# Patient Record
Sex: Female | Born: 1995 | Race: White | Hispanic: No | Marital: Single | Smoking: Never smoker
Health system: Southern US, Community
[De-identification: ages and names within clinical notes are randomized; demographics above are authoritative.]

## PROBLEM LIST (undated history)

## (undated) DIAGNOSIS — K219 Gastro-esophageal reflux disease without esophagitis: Secondary | ICD-10-CM

## (undated) DIAGNOSIS — R000 Tachycardia, unspecified: Secondary | ICD-10-CM

## (undated) DIAGNOSIS — I498 Other specified cardiac arrhythmias: Secondary | ICD-10-CM

## (undated) DIAGNOSIS — G901 Familial dysautonomia [Riley-Day]: Secondary | ICD-10-CM

## (undated) DIAGNOSIS — Q796 Ehlers-Danlos syndrome, unspecified: Secondary | ICD-10-CM

## (undated) DIAGNOSIS — I951 Orthostatic hypotension: Secondary | ICD-10-CM

## (undated) DIAGNOSIS — M249 Joint derangement, unspecified: Secondary | ICD-10-CM

## (undated) DIAGNOSIS — G90A Postural orthostatic tachycardia syndrome (POTS): Secondary | ICD-10-CM

---
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
## 2017-02-22 ENCOUNTER — Telehealth: Payer: Self-pay | Admitting: Internal Medicine

## 2017-02-22 DIAGNOSIS — G90A Postural orthostatic tachycardia syndrome (POTS): Secondary | ICD-10-CM

## 2017-02-22 DIAGNOSIS — I951 Orthostatic hypotension: Principal | ICD-10-CM

## 2017-02-22 DIAGNOSIS — I498 Other specified cardiac arrhythmias: Secondary | ICD-10-CM

## 2017-02-22 DIAGNOSIS — R000 Tachycardia, unspecified: Principal | ICD-10-CM

## 2017-02-22 DIAGNOSIS — Q796 Ehlers-Danlos syndrome, unspecified: Secondary | ICD-10-CM

## 2017-02-22 NOTE — Telephone Encounter (Signed)
Called pt and left a VM informing pt that she has refills left at her pharmacy already and if she has any other problems, questions or concerns to call our office.

## 2017-02-22 NOTE — Telephone Encounter (Signed)
Follow up      Pt has an appt 03-26-17 with Dr Tenny Craw.  Mother states that when pt saw the pediatric cardiologist, she had an echo every year.  Calling to see if she will need an echo before/during her may 21st ov.  Please call

## 2017-02-22 NOTE — Telephone Encounter (Signed)
New message       *STAT* If patient is at the pharmacy, call can be transferred to refill team.   1. Which medications need to be refilled? (please list name of each medication and dose if known) atenolol  2. Which pharmacy/location (including street and city if local pharmacy) is medication to be sent to? [facility] [address]. Do they need a 30 day or 90 day supply? 90 day

## 2017-02-22 NOTE — Telephone Encounter (Signed)
Will route to Dr. Tenny Craw for review and advisement on if echo is needed.

## 2017-02-27 NOTE — Telephone Encounter (Signed)
Left detailed message on mother's voice mail (per DPR) that we will schedule echo for prior to her appointment with Dr. Tenny Craw 03/26/17  Order placed.

## 2017-02-27 NOTE — Telephone Encounter (Signed)
Please set up for echo Hx of POTS and Ehlers-Danlos syndrome

## 2017-03-06 ENCOUNTER — Other Ambulatory Visit: Payer: Self-pay | Admitting: *Deleted

## 2017-03-06 DIAGNOSIS — Q796 Ehlers-Danlos syndrome, unspecified: Secondary | ICD-10-CM

## 2017-03-06 DIAGNOSIS — M545 Low back pain, unspecified: Secondary | ICD-10-CM

## 2017-03-07 ENCOUNTER — Encounter: Payer: Self-pay | Admitting: *Deleted

## 2017-03-14 ENCOUNTER — Ambulatory Visit (HOSPITAL_COMMUNITY): Payer: 59 | Attending: Cardiovascular Disease

## 2017-03-14 ENCOUNTER — Other Ambulatory Visit: Payer: Self-pay

## 2017-03-14 DIAGNOSIS — I951 Orthostatic hypotension: Secondary | ICD-10-CM | POA: Diagnosis present

## 2017-03-14 DIAGNOSIS — I498 Other specified cardiac arrhythmias: Secondary | ICD-10-CM

## 2017-03-14 DIAGNOSIS — Q796 Ehlers-Danlos syndrome, unspecified: Secondary | ICD-10-CM

## 2017-03-14 DIAGNOSIS — G90A Postural orthostatic tachycardia syndrome (POTS): Secondary | ICD-10-CM

## 2017-03-14 DIAGNOSIS — I071 Rheumatic tricuspid insufficiency: Secondary | ICD-10-CM | POA: Diagnosis not present

## 2017-03-14 DIAGNOSIS — R000 Tachycardia, unspecified: Secondary | ICD-10-CM | POA: Diagnosis present

## 2017-03-15 ENCOUNTER — Ambulatory Visit
Admission: RE | Admit: 2017-03-15 | Discharge: 2017-03-15 | Disposition: A | Discharge status: Home / Self Care | Payer: 59 | Source: Ambulatory Visit | Attending: Sports Medicine | Admitting: Sports Medicine

## 2017-03-15 DIAGNOSIS — M545 Low back pain, unspecified: Secondary | ICD-10-CM

## 2017-03-15 DIAGNOSIS — Q796 Ehlers-Danlos syndrome, unspecified: Secondary | ICD-10-CM

## 2017-03-16 ENCOUNTER — Other Ambulatory Visit: Payer: BLUE CROSS/BLUE SHIELD

## 2017-03-22 ENCOUNTER — Ambulatory Visit (INDEPENDENT_AMBULATORY_CARE_PROVIDER_SITE_OTHER): Payer: 59 | Admitting: Sports Medicine

## 2017-03-22 ENCOUNTER — Encounter: Payer: Self-pay | Admitting: Sports Medicine

## 2017-03-22 DIAGNOSIS — M2480 Other specific joint derangements of unspecified joint, not elsewhere classified: Secondary | ICD-10-CM | POA: Diagnosis not present

## 2017-03-22 DIAGNOSIS — I951 Orthostatic hypotension: Secondary | ICD-10-CM | POA: Diagnosis not present

## 2017-03-22 DIAGNOSIS — R000 Tachycardia, unspecified: Secondary | ICD-10-CM

## 2017-03-22 DIAGNOSIS — I498 Other specified cardiac arrhythmias: Secondary | ICD-10-CM

## 2017-03-22 DIAGNOSIS — G90A Postural orthostatic tachycardia syndrome (POTS): Secondary | ICD-10-CM

## 2017-03-23 NOTE — Assessment & Plan Note (Signed)
Note she is having some increased bouts of tachycardia Nortriptyline may be an issue but is only medicine that has helped control her pain adequately  Lot os fatigue and question if even low dose atenolol contributes  I suggested D/W Dr Tenny Crawoss and seeing if any change in meds would help

## 2017-03-23 NOTE — Assessment & Plan Note (Signed)
This is causing a lot of pain now primairly at low back and SI joints  I think the specific PT program is a good plan ([facility] Pilates program seemed too hard  Cont nortriptyline Reck after trial on this over next 3 mos

## 2017-03-23 NOTE — Progress Notes (Signed)
Fatigue and sacral and LBP  Patient with known EDS 3 and chronic arthralgias/ [place] pain syndrome related to this WE discussed Naltrexone trial but she decided not to try this yet Now back on nortriptyline 40 and this helps moderate pain Does not tolerate opioids Nortriptyline tends to worsen her POTS sxs particularly the tachycardia  Now with lots of pain over sacrum and sits on special wedge pillow Pain into low back  She has an EDS network of specialists and they wanted an MRI before she starts on a special PT program.  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

## 2017-03-26 ENCOUNTER — Encounter: Payer: Self-pay | Admitting: Internal Medicine

## 2017-03-26 ENCOUNTER — Ambulatory Visit (INDEPENDENT_AMBULATORY_CARE_PROVIDER_SITE_OTHER): Payer: 59 | Admitting: Internal Medicine

## 2017-03-26 VITALS — BP 108/76 | HR 73 | Ht 63.0 in | Wt 97.6 lb

## 2017-03-26 DIAGNOSIS — D8940 Mast cell activation, unspecified: Secondary | ICD-10-CM

## 2017-03-26 DIAGNOSIS — I951 Orthostatic hypotension: Secondary | ICD-10-CM

## 2017-03-26 DIAGNOSIS — I498 Other specified cardiac arrhythmias: Secondary | ICD-10-CM

## 2017-03-26 DIAGNOSIS — R000 Tachycardia, unspecified: Secondary | ICD-10-CM

## 2017-03-26 DIAGNOSIS — G90A Postural orthostatic tachycardia syndrome (POTS): Secondary | ICD-10-CM

## 2017-03-26 MED ORDER — SODIUM CHLORIDE 0.9 % IV BOLUS (SEPSIS)
1000.0000 mL | Freq: Once | INTRAVENOUS | Status: AC
  Administered 2017-03-26: 1000 mL via INTRAVENOUS

## 2017-03-26 NOTE — Progress Notes (Signed)
Cardiology Office Note   Date:  03/26/2017   ID:  Juwana Thoreson, DOB 1996-09-26, MRN [id number]  PCP:  Maurice Small, MD  Cardiologist:   Dietrich Pates, MD    F/U of autonomic dysfunction     History of Present Illness: Jean Larson is a 21 y.o. female with a history of Ehlers-Danlos syndrome and POTS  I saw her back in Oct 2017  She is alos followed by B Fields and by Izell Mapleton [phone].   Since seen she continues to have dizzy and syncopal spells  Saturday had 3 syncoapl spells She says she tryies to drink fluids  Has signfi problems with food intake  Trying to get seen at [facility] autonomic GI program.  Patient is still having school at home with a reduced load  This semester has been d Difficult Recent echo    Pt has mast cell disorder  Being treated with cromolyn  Looking for referral for second opinion     Current Meds  Medication Sig  . atenolol (TENORMIN) 25 MG tablet   . cromolyn (GASTROCROM) 100 MG/5ML solution cromolyn 100 mg/5 mL oral concentrate  INHALE 1 VIAL BEFORE EACH MEAL QID  . EPINEPHrine 0.3 mg/0.3 mL IJ SOAJ injection INJECT INTRAMUSCULARLY AS DIRECTED  . fexofenadine (ALLEGRA) 30 MG tablet Take 30 mg by mouth 2 (two) times daily.  . methylphenidate (RITALIN) 10 MG tablet Take 10 mg by mouth daily.   Marland Kitchen METRONIDAZOLE, TOPICAL, 0.75 % LOTN APP TOPICALLY A THIN LAYER TO FACE BID  . MICROGESTIN 1-20 MG-MCG tablet Take 1 tablet by mouth daily.  . nortriptyline (PAMELOR) 10 MG capsule nortriptyline 10 mg capsule  TK 1 TO 3 CS PO QHS UTD  . ondansetron (ZOFRAN) 8 MG tablet TK 1 T PO QD PRN  . TRETINOIN EX Apply topically daily.     Allergies:   Cherry and Plum pulp   Past Medical History:  Diagnosis Date  . Dysautonomia   . Ehlers-Danlos disease   . GERD (gastroesophageal reflux disease)   . Hypermobility of joint   . POTS (postural orthostatic tachycardia syndrome)     History reviewed. No pertinent surgical history.   Social  History:  The patient  reports that she has never smoked. She has never used smokeless tobacco. She reports that she does not drink alcohol or use drugs.   Family History:  The patient's family history includes Alzheimer's disease in her maternal grandfather; Asthma in her sister; Heart disease in her maternal grandfather; Hypertension in her maternal grandfather; Parkinson's disease in her maternal grandfather; Stroke in her maternal grandfather.    ROS:  Please see the history of present illness. All other systems are reviewed and  Negative to the above problem except as noted.    PHYSICAL EXAM: VS:  BP 112/74   Pulse (!) 126   Ht 5\' 3"  (1.6 m)   Wt 97 lb 9.6 oz (44.3 kg)   SpO2 99%   BMI 17.29 kg/m   GEN: Thin 21 yo , in no acute distress   Yawns a lot HEENT: normal  Neck: no JVD, carotid bruits, or masses Cardiac: RRR; no murmurs, rubs, or gallops,no edema  Respiratory:  clear to auscultation bilaterally, normal work of breathing GI: soft, nontender, nondistended, + BS  No hepatomegaly  MS: no deformity Moving all extremities   Skin: warm and dry, no rash Neuro:  Strength and sensation are intact Psych: euthymic mood, full affect   EKG:  EKG is not  ordered today.   Lipid Panel No results found for: CHOL, TRIG, HDL, CHOLHDL, VLDL, LDLCALC, LDLDIRECT    Wt Readings from Last 3 Encounters:  03/26/17 97 lb 9.6 oz (44.3 kg) (3 %, Z= -1.95)*  03/22/17 96 lb (43.5 kg) (2 %, Z= -2.11)*  09/14/16 95 lb (43.1 kg) (2 %, Z= -2.15)*   * Growth percentiles are based on CDC 2-20 Years data.      ASSESSMENT AND PLAN:  1  Autonomicdysfunction Pt continues to be symptomatic  HR increased with standing form 105 (laying) to 122 (standing) With syncopal spells I would recomm IV fluids  Follow HR and BP Will review medication history and contact MD in [place]  2  Mast cell activation syndrome   Pt given name for L Afrin in [place]  Current medicines are reviewed at length with the patient  today.  The patient does not have concerns regarding medicines.  Signed, Dietrich PatesPaula Taquita Demby, MD  03/26/2017 12:28 PM    [facility] [address], [place], [place]  [address] Phone: [phone]; Fax: [phone]

## 2017-03-27 ENCOUNTER — Ambulatory Visit: Payer: 59 | Admitting: Sports Medicine

## 2017-04-04 ENCOUNTER — Telehealth: Payer: Self-pay | Admitting: Internal Medicine

## 2017-04-04 DIAGNOSIS — I951 Orthostatic hypotension: Principal | ICD-10-CM

## 2017-04-04 DIAGNOSIS — G90A Postural orthostatic tachycardia syndrome (POTS): Secondary | ICD-10-CM

## 2017-04-04 DIAGNOSIS — I498 Other specified cardiac arrhythmias: Secondary | ICD-10-CM

## 2017-04-04 DIAGNOSIS — R000 Tachycardia, unspecified: Principal | ICD-10-CM

## 2017-04-04 NOTE — Telephone Encounter (Signed)
New message    Pt is calling stating that iv's did work. She said she is a POTs pt and that specialist is recommending a holter monitor. She is calling to find out if she can do this. She is also going to send her all the consult notes before Friday.

## 2017-04-05 ENCOUNTER — Telehealth: Payer: Self-pay | Admitting: Internal Medicine

## 2017-04-05 NOTE — Telephone Encounter (Signed)
Walk In pt Form-pt dropped off records for Dr.Ross. Placed in ross doc box.

## 2017-04-06 NOTE — Telephone Encounter (Signed)
Notes received and given to Dr. Tenny Crawoss today.  Will route this message to her to review/recommendations regarding holter monitor.

## 2017-04-09 NOTE — Telephone Encounter (Signed)
Spoke to mother   WOuld recomm pt be set up for CBC with differential, Ferritin, BMET and 24 hour urine sodium

## 2017-04-09 NOTE — Telephone Encounter (Signed)
Spoke with patient's mother to make arrangements for lab appointment for blood tests here on Wednesday morning and gave her instructions to then go downstairs to [facility] for 24 hour urine sodium. Patient's mother verbalized understanding and agreement with plan and thanked me for the call.

## 2017-04-09 NOTE — Telephone Encounter (Signed)
Left message for patient to call back about ordering lab results. 24 hour urine sodium will need to be performed at the [facility] location on the first floor of our office.

## 2017-04-11 ENCOUNTER — Other Ambulatory Visit: Payer: 59 | Admitting: *Deleted

## 2017-04-11 DIAGNOSIS — I498 Other specified cardiac arrhythmias: Secondary | ICD-10-CM

## 2017-04-11 DIAGNOSIS — R000 Tachycardia, unspecified: Principal | ICD-10-CM

## 2017-04-11 DIAGNOSIS — G90A Postural orthostatic tachycardia syndrome (POTS): Secondary | ICD-10-CM

## 2017-04-11 DIAGNOSIS — I951 Orthostatic hypotension: Principal | ICD-10-CM

## 2017-04-12 LAB — FERRITIN: Ferritin: 61 ng/mL (ref 15–77)

## 2017-04-12 LAB — CBC WITH DIFFERENTIAL/PLATELET
Basophils Absolute: 0 10*3/uL (ref 0.0–0.2)
Basos: 1 %
EOS (ABSOLUTE): 0.1 10*3/uL (ref 0.0–0.4)
Eos: 3 %
Hematocrit: 41.7 % (ref 34.0–46.6)
Hemoglobin: 13.6 g/dL (ref 11.1–15.9)
Immature Grans (Abs): 0 10*3/uL (ref 0.0–0.1)
Immature Granulocytes: 0 %
Lymphocytes Absolute: 2.3 10*3/uL (ref 0.7–3.1)
Lymphs: 47 %
MCH: 29.3 pg (ref 26.6–33.0)
MCHC: 32.6 g/dL (ref 31.5–35.7)
MCV: 90 fL (ref 79–97)
Monocytes Absolute: 0.2 10*3/uL (ref 0.1–0.9)
Monocytes: 4 %
Neutrophils Absolute: 2.2 10*3/uL (ref 1.4–7.0)
Neutrophils: 45 %
Platelets: 212 10*3/uL (ref 150–379)
RBC: 4.64 x10E6/uL (ref 3.77–5.28)
RDW: 12.9 % (ref 12.3–15.4)
WBC: 4.9 10*3/uL (ref 3.4–10.8)

## 2017-04-14 LAB — SODIUM, URINE, 24 HOUR
Sodium, 24H Ur: 59 mmol/24 hr (ref 39–258)
Sodium, Ur: 27 mmol/L

## 2017-05-02 ENCOUNTER — Encounter: Payer: Self-pay | Admitting: Internal Medicine

## 2017-05-03 ENCOUNTER — Encounter: Payer: Self-pay | Admitting: Internal Medicine

## 2017-05-17 NOTE — Telephone Encounter (Signed)
Spoke to Reynolds American[facility]ry Afrin ([place]  Specializes in Mast Cell Activation disorder.  He has agreed to see Jean Larson  Spoke to mother and gave her number  For his office  [phone]

## 2017-06-08 ENCOUNTER — Telehealth: Payer: Self-pay | Admitting: Internal Medicine

## 2017-06-08 NOTE — Telephone Encounter (Signed)
Patient going to see mast cell specialist in [place] on Tue,  They request a 24 hr urine to be done on Monday.  She needs the supplies.  Advised her to get prescription from that doctor and take to [facility] for collection container. She is appreciative for the information provided.  Advised to call back with any other concerns.

## 2017-06-08 NOTE — Telephone Encounter (Signed)
New message   Pt is calling asking if she can come get the kit for a 24 hour urine. She said it's so she can go to the specialist doctor in [place].

## 2017-06-18 ENCOUNTER — Telehealth: Payer: Self-pay | Admitting: Internal Medicine

## 2017-06-18 NOTE — Telephone Encounter (Signed)
New message    Pt is calling asking for an appt to come in for IV fluids. She said her symptoms have been really bad this week.

## 2017-06-18 NOTE — Telephone Encounter (Signed)
Advised patient that Dr. Tenny Crawoss is out of town.  Advised that typically the office is not equipped to bring her in for fluids on an as needed basis.  She has had a flare over the past week and feels that IV fluids help her bounce back from this.    Patient reports near syncope a couple times this past week.  I advised she could go to the ER for evaluation and potentially fluids, or get in touch with her PCP who may be able to arrange something since Dr. Tenny Crawoss is not available this week.  She is aware I will forward to Dr. Tenny Crawoss to make her aware.

## 2017-07-23 ENCOUNTER — Ambulatory Visit (INDEPENDENT_AMBULATORY_CARE_PROVIDER_SITE_OTHER): Payer: 59 | Admitting: Internal Medicine

## 2017-07-23 ENCOUNTER — Telehealth: Payer: Self-pay | Admitting: *Deleted

## 2017-07-23 ENCOUNTER — Ambulatory Visit (HOSPITAL_COMMUNITY)
Admission: RE | Admit: 2017-07-23 | Discharge: 2017-07-23 | Disposition: A | Discharge status: Home / Self Care | Payer: 59 | Source: Ambulatory Visit | Attending: Internal Medicine | Admitting: Internal Medicine

## 2017-07-23 ENCOUNTER — Other Ambulatory Visit (HOSPITAL_COMMUNITY): Payer: Self-pay | Admitting: *Deleted

## 2017-07-23 ENCOUNTER — Other Ambulatory Visit: Payer: Self-pay | Admitting: *Deleted

## 2017-07-23 ENCOUNTER — Encounter: Payer: Self-pay | Admitting: Internal Medicine

## 2017-07-23 VITALS — BP 110/70 | HR 78 | Ht 63.03 in | Wt 101.0 lb

## 2017-07-23 DIAGNOSIS — Q796 Ehlers-Danlos syndrome: Secondary | ICD-10-CM | POA: Diagnosis present

## 2017-07-23 DIAGNOSIS — R0989 Other specified symptoms and signs involving the circulatory and respiratory systems: Secondary | ICD-10-CM

## 2017-07-23 DIAGNOSIS — I951 Orthostatic hypotension: Secondary | ICD-10-CM | POA: Diagnosis not present

## 2017-07-23 DIAGNOSIS — G901 Familial dysautonomia [Riley-Day]: Secondary | ICD-10-CM | POA: Insufficient documentation

## 2017-07-23 DIAGNOSIS — D8940 Mast cell activation, unspecified: Secondary | ICD-10-CM | POA: Diagnosis not present

## 2017-07-23 DIAGNOSIS — R000 Tachycardia, unspecified: Secondary | ICD-10-CM

## 2017-07-23 DIAGNOSIS — I498 Other specified cardiac arrhythmias: Secondary | ICD-10-CM

## 2017-07-23 DIAGNOSIS — G90A Postural orthostatic tachycardia syndrome (POTS): Secondary | ICD-10-CM

## 2017-07-23 MED ORDER — SODIUM CHLORIDE 0.9 % IV SOLN
Freq: Once | INTRAVENOUS | Status: AC
  Administered 2017-07-23: 12:00:00 via INTRAVENOUS

## 2017-07-23 NOTE — Progress Notes (Signed)
Cardiology Office Note   Date:  07/23/2017   ID:  Harman Ferrin, DOB 22-Nov-1995, MRN [id number]  PCP:  Maurice Small, MD  Cardiologist:   Dietrich Pates, MD    F/U of autonomic dysfunction     History of Present Illness: Jean Larson is a 21 y.o. female with a history of Ehlers-Danlos syndrome and POTS  I saw her back in Oct 2017  She is alos followed by B Fields and by Izell Oatfield [phone].   Since seen she continues to have dizzy and syncopal spells  Saturday had 3 syncoapl spells She says she tryies to drink fluids  Has signfi problems with food intake  Trying to get seen at [facility] autonomic GI program.  Patient is still having school at home with a reduced load  This semester has been d Difficult Recent echo    Pt has mast cell disorder  Being treated with cromolyn  Looking for referral for second opinion     Current Meds  Medication Sig  . atenolol (TENORMIN) 25 MG tablet   . cromolyn (GASTROCROM) 100 MG/5ML solution cromolyn 100 mg/5 mL oral concentrate  INHALE 1 VIAL BEFORE EACH MEAL QID  . EPINEPHrine 0.3 mg/0.3 mL IJ SOAJ injection INJECT INTRAMUSCULARLY AS DIRECTED  . Levocetirizine Dihydrochloride (XYZAL PO) Take by mouth as directed.  . methylphenidate (RITALIN) 10 MG tablet Take 10 mg by mouth daily.   Marland Kitchen METRONIDAZOLE, TOPICAL, 0.75 % LOTN APP TOPICALLY A THIN LAYER TO FACE BID  . MICROGESTIN 1-20 MG-MCG tablet Take 1 tablet by mouth daily.  . ondansetron (ZOFRAN) 8 MG tablet TK 1 T PO QD PRN  . TRETINOIN EX Apply topically daily.     Allergies:   Cherry and Plum pulp   Past Medical History:  Diagnosis Date  . Dysautonomia   . Ehlers-Danlos disease   . GERD (gastroesophageal reflux disease)   . Hypermobility of joint   . POTS (postural orthostatic tachycardia syndrome)     History reviewed. No pertinent surgical history.   Social History:  The patient  reports that she has never smoked. She has never used smokeless tobacco. She reports  that she does not drink alcohol or use drugs.   Family History:  The patient's family history includes Alzheimer's disease in her maternal grandfather; Asthma in her sister; Heart disease in her maternal grandfather; Hypertension in her maternal grandfather; Parkinson's disease in her maternal grandfather; Stroke in her maternal grandfather.    ROS:  Please see the history of present illness. All other systems are reviewed and  Negative to the above problem except as noted.    PHYSICAL EXAM: VS:  BP 110/70   Pulse 78   Ht 5' 3.02" (1.601 m)   Wt 101 lb (45.8 kg)   BMI 17.88 kg/m   GEN: Thin 21 yo , in no acute distress   Yawns a lot HEENT: normal  Neck: no JVD, carotid bruits, or masses Cardiac: RRR; no murmurs, rubs, or gallops,no edema  Respiratory:  clear to auscultation bilaterally, normal work of breathing GI: soft, nontender, nondistended, + BS  No hepatomegaly  MS: no deformity Moving all extremities   Skin: warm and dry, no rash Neuro:  Strength and sensation are intact Psych: euthymic mood, full affect   EKG:  EKG is not ordered today.   Lipid Panel No results found for: CHOL, TRIG, HDL, CHOLHDL, VLDL, LDLCALC, LDLDIRECT    Wt Readings from Last 3 Encounters:  07/23/17 101 lb (45.8 kg) (  5 %, Z= -1.67)*  03/26/17 97 lb 9.6 oz (44.3 kg) (3 %, Z= -1.95)*  03/22/17 96 lb (43.5 kg) (2 %, Z= -2.11)*   * Growth percentiles are based on CDC 2-20 Years data.      ASSESSMENT AND PLAN:  1  Autonomicdysfunction Pt continues to be symptomatic  HR increased with standing form 105 (laying) to 122 (standing) With syncopal spells I would recomm IV fluids  Follow HR and BP Will review medication history and contact MD in [place]  2  Mast cell activation syndrome   Pt given name for L Afrin in [place]  3  Bruit  Will set up for LE arterieal dopplers   Current medicines are reviewed at length with the patient today.  The patient does not have concerns regarding  medicines.  Signed, Dietrich Pates, MD  07/23/2017 8:25 AM    [facility] [address] [place], [place], [place]  [address] Phone: [phone]; Fax: [phone]

## 2017-07-23 NOTE — Patient Instructions (Addendum)
Your physician recommends that you continue on your current medications as directed. Please refer to the Current Medication list given to you today.  Go to [facility] Go down the hall to the desk and register to be seen in Short Stay.  They are expecting you for IV fluids this morning.  Addendum: after patient left office, Dr. Tenny Craw advised pt needs LEA doppler for bruit.  Placed order.  Called patient.  Left message to call office tomorrow (9/18) to inform of this plan and to see if she got IV fluids today as planned.

## 2017-07-23 NOTE — Telephone Encounter (Signed)
Ov today with Dr. Tenny Craw.  after patient left office, Dr. Tenny Craw advised pt needs LEA doppler for bruit.  Placed order.  Called patient.  Left message to call office tomorrow (9/18) to inform of this plan and to see if she got IV fluids today as planned.

## 2017-07-24 NOTE — Telephone Encounter (Signed)
Left message for patient to call back  

## 2017-07-31 ENCOUNTER — Other Ambulatory Visit: Payer: Self-pay | Admitting: Internal Medicine

## 2017-07-31 DIAGNOSIS — R0989 Other specified symptoms and signs involving the circulatory and respiratory systems: Secondary | ICD-10-CM

## 2017-08-01 ENCOUNTER — Ambulatory Visit (HOSPITAL_COMMUNITY)
Admission: RE | Admit: 2017-08-01 | Discharge: 2017-08-01 | Disposition: A | Discharge status: Home / Self Care | Payer: 59 | Source: Ambulatory Visit | Attending: Cardiology | Admitting: Cardiology

## 2017-08-01 DIAGNOSIS — Q796 Ehlers-Danlos syndrome: Secondary | ICD-10-CM | POA: Insufficient documentation

## 2017-08-01 DIAGNOSIS — R0989 Other specified symptoms and signs involving the circulatory and respiratory systems: Secondary | ICD-10-CM | POA: Diagnosis present

## 2017-08-01 NOTE — Telephone Encounter (Signed)
Ultrasound of lower extremities completed today.

## 2017-08-05 ENCOUNTER — Encounter: Payer: Self-pay | Admitting: Internal Medicine

## 2017-10-09 ENCOUNTER — Encounter: Payer: Self-pay | Admitting: Internal Medicine

## 2017-10-11 ENCOUNTER — Telehealth: Payer: Self-pay | Admitting: Internal Medicine

## 2017-10-11 NOTE — Telephone Encounter (Signed)
New message  Pt verbalized that she is returning call for the rn   Please speak to pt mother when you call back

## 2017-10-11 NOTE — Telephone Encounter (Signed)
Per Pt Advice Request from 12/4--- Patient would like to be scheduled for IV fluids on Tue 10/16/17.  Spoke with her mother.  Arranged with [facility]- pt will arrive for noon appointment for 1 liter NSS per Dr. Tenny Crawoss.  Physician Orders completed and faxed to [facility] Day: [phone].  Spoke with Laverne.

## 2017-10-12 ENCOUNTER — Other Ambulatory Visit (HOSPITAL_COMMUNITY): Payer: Self-pay | Admitting: *Deleted

## 2017-10-16 ENCOUNTER — Ambulatory Visit (HOSPITAL_COMMUNITY)
Admission: RE | Admit: 2017-10-16 | Discharge: 2017-10-16 | Disposition: A | Discharge status: Home / Self Care | Payer: 59 | Source: Ambulatory Visit | Attending: Internal Medicine | Admitting: Internal Medicine

## 2017-10-16 DIAGNOSIS — G901 Familial dysautonomia [Riley-Day]: Secondary | ICD-10-CM | POA: Insufficient documentation

## 2017-10-16 MED ORDER — SODIUM CHLORIDE 0.9 % IV SOLN
Freq: Once | INTRAVENOUS | Status: AC
  Administered 2017-10-16: 12:00:00 via INTRAVENOUS

## 2017-10-20 ENCOUNTER — Encounter: Payer: Self-pay | Admitting: Internal Medicine

## 2017-11-19 DIAGNOSIS — R0789 Other chest pain: Secondary | ICD-10-CM | POA: Diagnosis not present

## 2018-01-02 DIAGNOSIS — J029 Acute pharyngitis, unspecified: Secondary | ICD-10-CM | POA: Diagnosis not present

## 2018-01-07 ENCOUNTER — Telehealth: Payer: Self-pay | Admitting: Internal Medicine

## 2018-01-07 NOTE — Telephone Encounter (Signed)
New Message   Pt c/o medication issue:  1. Name of Medication: atenolol  2. How are you currently taking this medication (dosage and times per day)? 25mg  once a day    3. Are you having a reaction (difficulty breathing--STAT)? no  4. What is your medication issue? Patient states that she normally gets atenolol prescribe from another provider. But she is wondering would Dr. Tenny Crawoss prescribe this medication for her now. So that she can be getting most of her care from one provider. Please call.

## 2018-01-07 NOTE — Telephone Encounter (Signed)
Pt requests that she get future refills of atenolol from Dr. Tenny Crawoss.  She had last ov in September 2018 with Dr. Tenny Crawoss.  No follow up scheduled as of now.  Routing to Dr. Tenny Crawoss to advise.

## 2018-01-07 NOTE — Telephone Encounter (Signed)
OK to refill

## 2018-01-08 MED ORDER — ATENOLOL 25 MG PO TABS: 25.0000 mg | ORAL_TABLET | Freq: Every day | ORAL | 6 refills | Status: DC

## 2018-01-08 NOTE — Telephone Encounter (Signed)
lmtcb

## 2018-01-08 NOTE — Telephone Encounter (Signed)
Refills sent into requested pharmacy.  77mo refills given. Advised pt to discuss having future refills filled by PCP.  Asked her to call the office and schedule an appt w/ Dr. Tenny Crawoss in 6 months if PCP unwilling to refill medication from now on. Patient verbalized understanding and agreeable to plan.

## 2018-03-06 DIAGNOSIS — Z01419 Encounter for gynecological examination (general) (routine) without abnormal findings: Secondary | ICD-10-CM | POA: Diagnosis not present

## 2018-04-04 DIAGNOSIS — R102 Pelvic and perineal pain: Secondary | ICD-10-CM | POA: Diagnosis not present

## 2018-04-16 DIAGNOSIS — Q796 Ehlers-Danlos syndrome: Secondary | ICD-10-CM | POA: Diagnosis not present

## 2018-04-16 DIAGNOSIS — D8940 Mast cell activation, unspecified: Secondary | ICD-10-CM | POA: Diagnosis not present

## 2018-04-16 DIAGNOSIS — I951 Orthostatic hypotension: Secondary | ICD-10-CM | POA: Diagnosis not present

## 2018-04-16 DIAGNOSIS — D649 Anemia, unspecified: Secondary | ICD-10-CM | POA: Diagnosis not present

## 2018-04-29 ENCOUNTER — Telehealth: Payer: Self-pay | Admitting: Internal Medicine

## 2018-04-29 NOTE — Telephone Encounter (Signed)
New message   Patient calling with complaint of extreme fatigue, diarrhea and POTS flare up  Patient requesting IV treatment.

## 2018-04-29 NOTE — Telephone Encounter (Signed)
Agree with IV fluids but 1.  Pt needs labs (BMET, CBC0 2   Need alos records from Dr Vickie EpleyAfrin if seen 3  Set up for 1.5 to 2 L fluid (NS)

## 2018-04-29 NOTE — Telephone Encounter (Signed)
Last seen in Sept 2018. Huge flare up of fatigue has been really bad for about 1 month. Requests IV fluid. Vision grays out with position changes for few seconds. Does not have BP/HR readings.  Noticed heart rate feels little more increased than normal.  Drinking at least 2 liters of water daily. Doesn't feel like she is retaining it, especially with having bouts of loose stool currently. With mast cell disorder diarrhea alternates with constipation.  Also gets bloated and nauseated with drinking so much, which does not happen with IV fluid.  Pt aware I will forward to Dr. Tenny Crawoss for recommendations. If pt will have IV fluids, it would be best for her Tues after 10 am.

## 2018-04-30 ENCOUNTER — Ambulatory Visit (HOSPITAL_COMMUNITY)
Admission: RE | Admit: 2018-04-30 | Discharge: 2018-04-30 | Disposition: A | Discharge status: Home / Self Care | Payer: 59 | Source: Ambulatory Visit | Attending: Internal Medicine | Admitting: Internal Medicine

## 2018-04-30 DIAGNOSIS — I951 Orthostatic hypotension: Secondary | ICD-10-CM | POA: Diagnosis not present

## 2018-04-30 MED ORDER — SODIUM CHLORIDE 0.9 % IV SOLN
Freq: Once | INTRAVENOUS | Status: AC
  Administered 2018-04-30: 11:00:00 via INTRAVENOUS

## 2018-04-30 NOTE — Telephone Encounter (Signed)
Reviewed with Dr. Tenny Crawoss. Ok for patient to get fluids without having lab results. Dr. Tenny Crawoss [facility] and then contacted patient to inform.  Pt is scheduled at 11:00 am today She was given the address.  Pt will have most recent ov from Dr. Vickie EpleyAfrin forwarded to Dr. Tenny Crawoss.

## 2018-04-30 NOTE — Progress Notes (Signed)
PATIENT CARE CENTER NOTE  Diagnosis: Mast cell activation syndrome/POTS    Provider: Dr. Tenny Crawoss   Procedure: 1 liter bolus of fluids   Note: Patient received 1 liter bolus of 0.9% Normal Saline. Order was for 1.5-2 liters bolus but patient requested to only receive 1 liter. Patient tolerated infusion well. Vital signs taken and remained stable. Patient alert, oriented and ambulatory at discharge.

## 2018-04-30 NOTE — Discharge Instructions (Signed)
Today you received a bolus of 1 liter 0.9% Normal Saline.

## 2018-04-30 NOTE — Telephone Encounter (Signed)
Called patient. She had labs last week at PCP. Will check with them to see what was drawn and will call me back.

## 2018-06-10 DIAGNOSIS — Z91018 Allergy to other foods: Secondary | ICD-10-CM | POA: Diagnosis not present

## 2018-06-10 DIAGNOSIS — J301 Allergic rhinitis due to pollen: Secondary | ICD-10-CM | POA: Diagnosis not present

## 2018-06-10 DIAGNOSIS — T781XXA Other adverse food reactions, not elsewhere classified, initial encounter: Secondary | ICD-10-CM | POA: Diagnosis not present

## 2018-07-16 DIAGNOSIS — Q796 Ehlers-Danlos syndrome: Secondary | ICD-10-CM | POA: Diagnosis not present

## 2018-07-16 DIAGNOSIS — R000 Tachycardia, unspecified: Secondary | ICD-10-CM | POA: Diagnosis not present

## 2018-07-16 DIAGNOSIS — D8940 Mast cell activation, unspecified: Secondary | ICD-10-CM | POA: Diagnosis not present

## 2018-07-25 IMAGING — MR MR LUMBAR SPINE W/O CM
4 of 5 series · 27 of 48 positions shown · non-contrast
Comparison: Lumbar MRI 03/29/2011

CLINICAL DATA: Ehlers-Danlos disease. Low back pain without
sciatica.

EXAM:
MRI LUMBAR SPINE WITHOUT CONTRAST
TECHNIQUE: Multiplanar, multisequence MR imaging of the lumbar spine was
performed. No intravenous contrast was administered.

[Series 4: T1 · sagittal · 4.0mm · 0.55mm/px · 6 of 13 slices shown (1 of 2)]
[im 1/13]
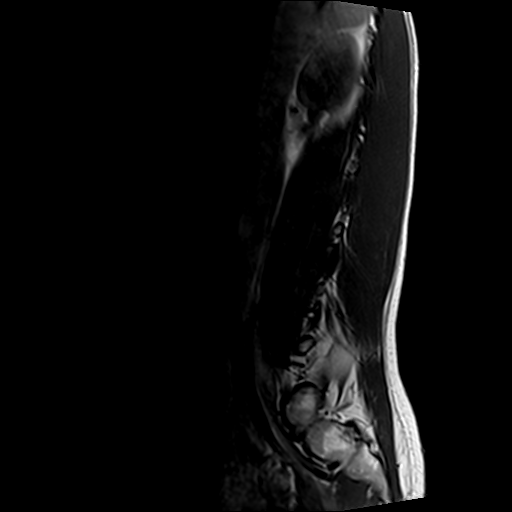
[im 3/13]
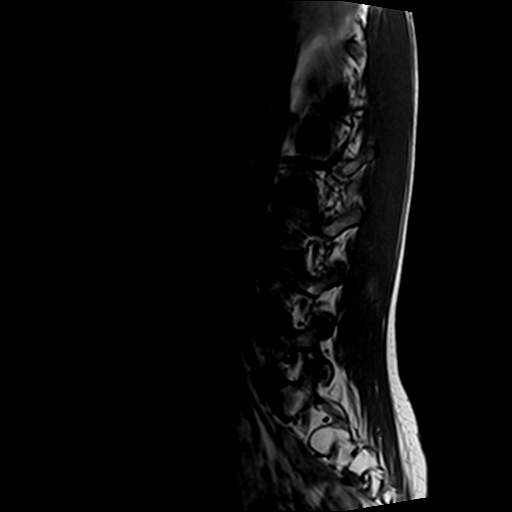
[im 5/13]
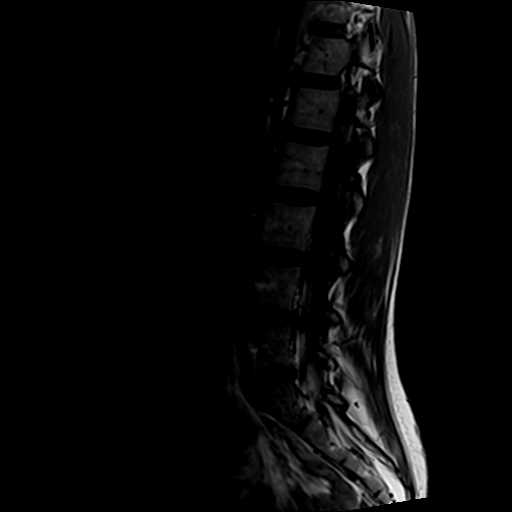
[im 8/13]
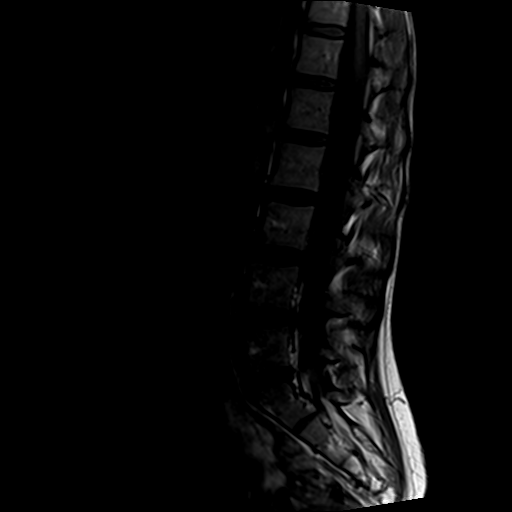
[im 10/13]
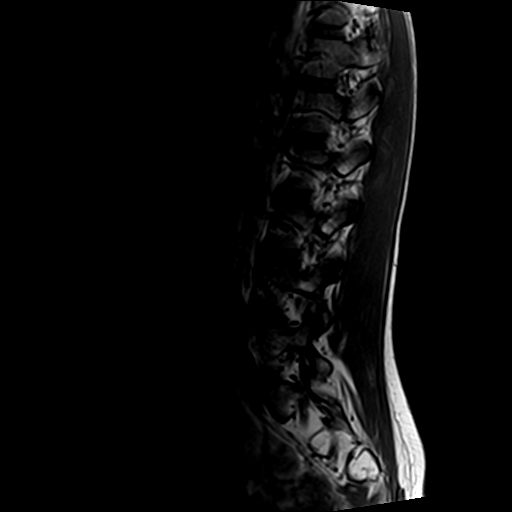
[im 13/13]
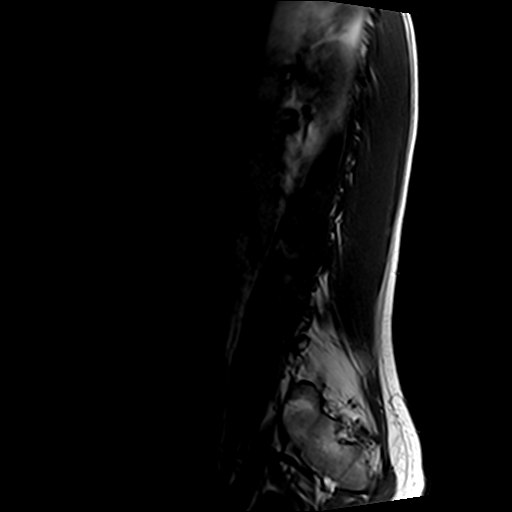

[Series 5: T2 · sagittal · 4.0mm · 0.55mm/px · 6 of 13 slices shown (1 of 2)]
[im 1/13]
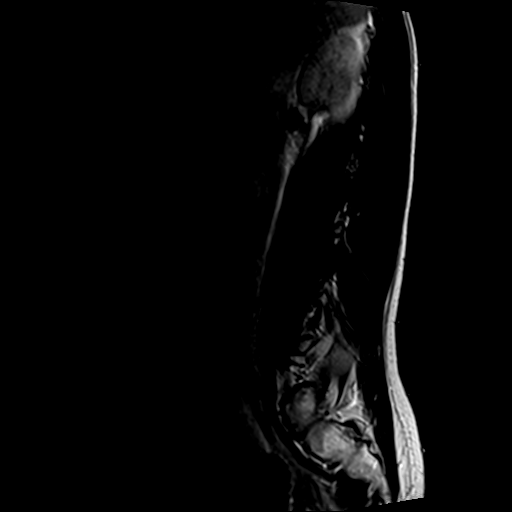
[im 3/13]
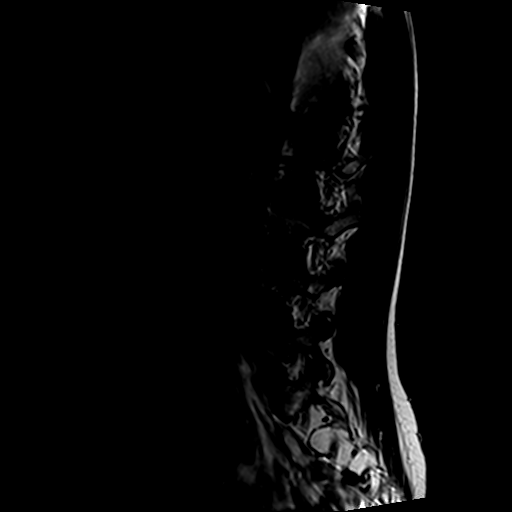
[im 5/13]
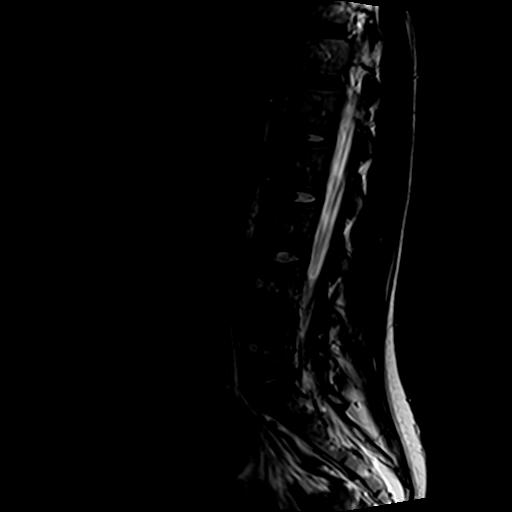
[im 8/13]
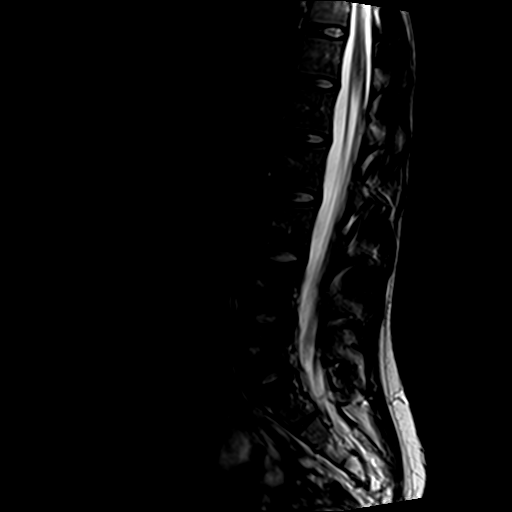
[im 10/13]
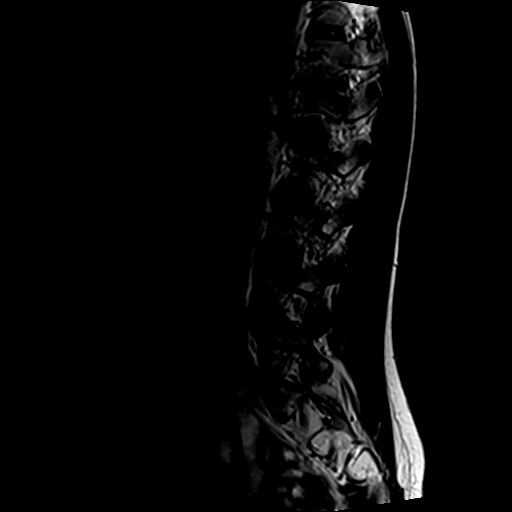
[im 13/13]
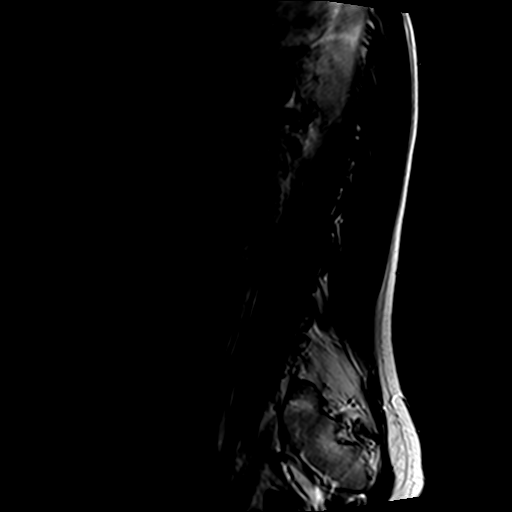

[Series 6: T2 · axial · 4.0mm · 0.70mm/px · z∈[-70,+108]mm · 9 of 34 slices shown (2 of 2)]
[im 1/34]
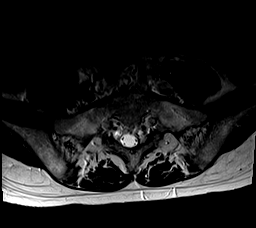
[im 5/34]
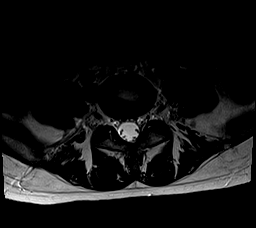
[im 10/34]
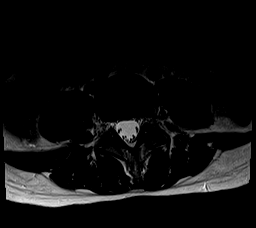
[im 15/34]
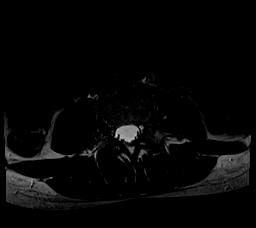
[im 17/34]
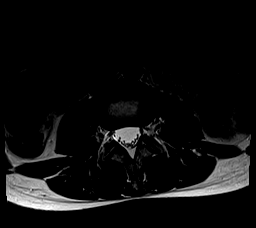
[im 19/34]
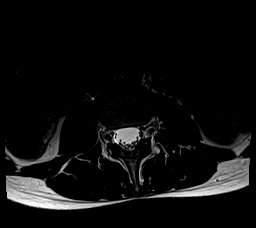
[im 24/34]
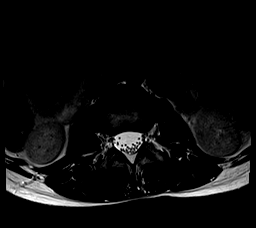
[im 29/34]
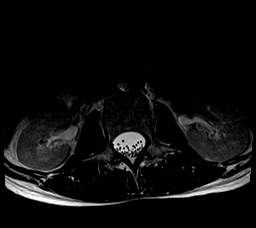
[im 34/34]
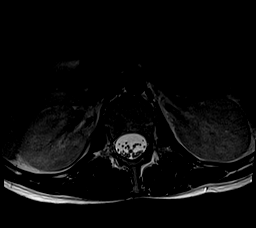

[Series 7: T1 · axial · 4.0mm · 0.35mm/px · z∈[-70,+84]mm · 6 of 34 slices shown (2 of 2)]
[im 1/34]
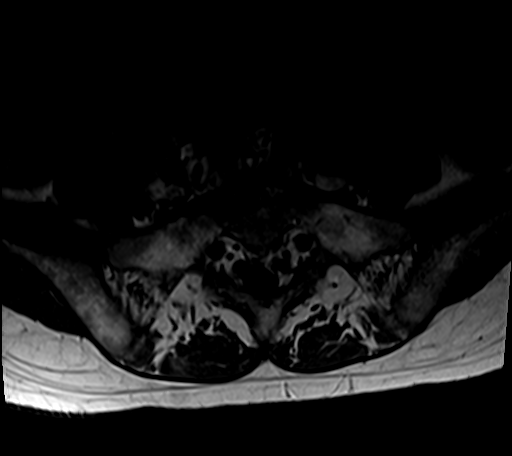
[im 5/34]
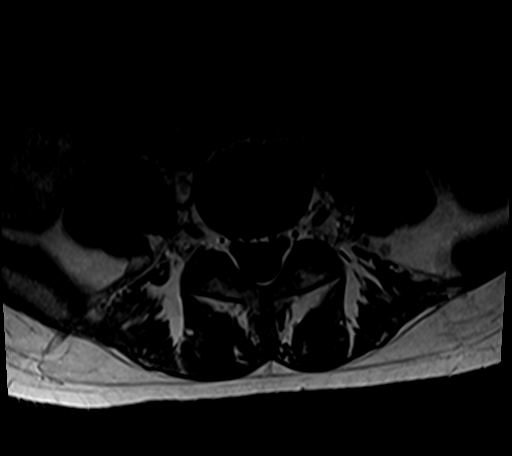
[im 10/34]
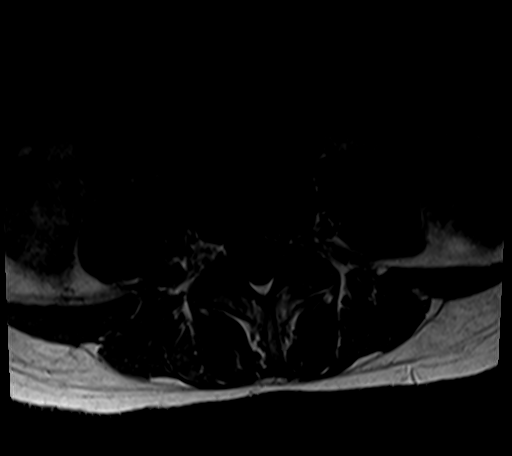
[im 15/34]
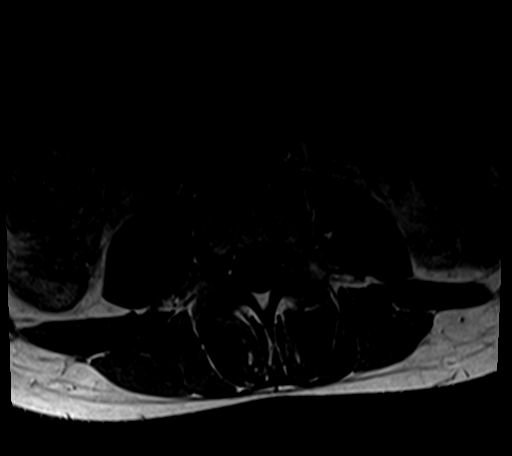
[im 17/34]
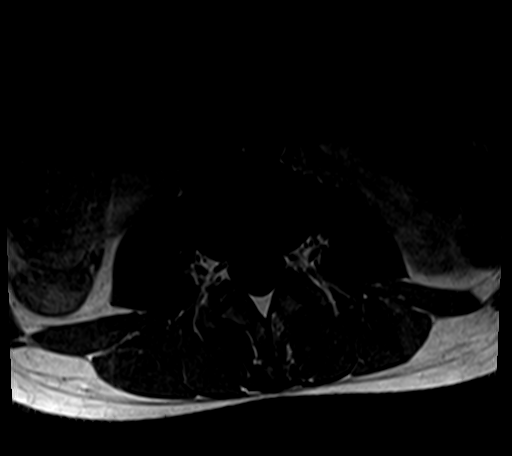
[im 29/34]
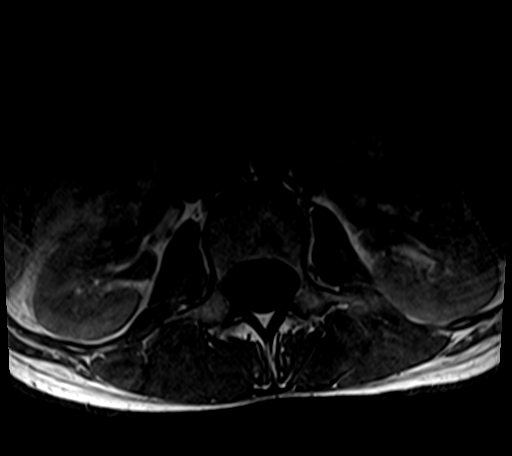

[27 of 48 positions shown; findings below may reference images not displayed]

FINDINGS: Segmentation:  Normal

Alignment:  Normal

Vertebrae: Normal vertebral bodies. Normal bone marrow. No pars
defect. Limited visualization of the SI joints appears normal.

Conus medullaris: Extends to the L1-2 level and appears normal.

Paraspinal and other soft tissues: Negative

Disc levels:

Normal disc spaces.  No disc protrusion or stenosis.
IMPRESSION: Normal lumbar MRI

## 2018-09-15 DIAGNOSIS — R509 Fever, unspecified: Secondary | ICD-10-CM | POA: Diagnosis not present

## 2018-09-15 DIAGNOSIS — R5383 Other fatigue: Secondary | ICD-10-CM | POA: Diagnosis not present

## 2018-09-16 DIAGNOSIS — K219 Gastro-esophageal reflux disease without esophagitis: Secondary | ICD-10-CM | POA: Diagnosis not present

## 2018-09-16 DIAGNOSIS — T148XXA Other injury of unspecified body region, initial encounter: Secondary | ICD-10-CM | POA: Diagnosis not present

## 2018-09-16 DIAGNOSIS — J029 Acute pharyngitis, unspecified: Secondary | ICD-10-CM | POA: Diagnosis not present

## 2018-09-20 DIAGNOSIS — M25521 Pain in right elbow: Secondary | ICD-10-CM | POA: Diagnosis not present

## 2018-09-27 DIAGNOSIS — R05 Cough: Secondary | ICD-10-CM | POA: Diagnosis not present

## 2018-09-27 DIAGNOSIS — J019 Acute sinusitis, unspecified: Secondary | ICD-10-CM | POA: Diagnosis not present

## 2018-10-07 DIAGNOSIS — R000 Tachycardia, unspecified: Secondary | ICD-10-CM | POA: Diagnosis not present

## 2018-10-07 DIAGNOSIS — Q796 Ehlers-Danlos syndrome, unspecified: Secondary | ICD-10-CM | POA: Diagnosis not present

## 2018-10-07 DIAGNOSIS — I951 Orthostatic hypotension: Secondary | ICD-10-CM | POA: Diagnosis not present

## 2018-12-12 DIAGNOSIS — J111 Influenza due to unidentified influenza virus with other respiratory manifestations: Secondary | ICD-10-CM | POA: Diagnosis not present

## 2019-01-18 DIAGNOSIS — R6889 Other general symptoms and signs: Secondary | ICD-10-CM | POA: Diagnosis not present

## 2019-01-18 DIAGNOSIS — R509 Fever, unspecified: Secondary | ICD-10-CM | POA: Diagnosis not present

## 2019-01-18 DIAGNOSIS — Z299 Encounter for prophylactic measures, unspecified: Secondary | ICD-10-CM | POA: Diagnosis not present

## 2019-01-18 DIAGNOSIS — J029 Acute pharyngitis, unspecified: Secondary | ICD-10-CM | POA: Diagnosis not present

## 2019-10-24 ENCOUNTER — Other Ambulatory Visit: Payer: Self-pay

## 2019-10-24 DIAGNOSIS — Z20822 Contact with and (suspected) exposure to covid-19: Secondary | ICD-10-CM

## 2019-10-25 LAB — NOVEL CORONAVIRUS, NAA: SARS-CoV-2, NAA: NOT DETECTED

## 2019-10-25 LAB — SPECIMEN STATUS REPORT

## 2019-11-15 ENCOUNTER — Other Ambulatory Visit: Payer: Self-pay

## 2019-11-15 DIAGNOSIS — Z20822 Contact with and (suspected) exposure to covid-19: Secondary | ICD-10-CM

## 2019-11-16 LAB — NOVEL CORONAVIRUS, NAA: SARS-CoV-2, NAA: NOT DETECTED

## 2019-11-21 ENCOUNTER — Ambulatory Visit: Payer: 59 | Attending: Internal Medicine

## 2019-11-21 DIAGNOSIS — Z20822 Contact with and (suspected) exposure to covid-19: Secondary | ICD-10-CM

## 2019-11-22 LAB — NOVEL CORONAVIRUS, NAA: SARS-CoV-2, NAA: NOT DETECTED

## 2020-01-21 ENCOUNTER — Encounter: Payer: Self-pay | Admitting: Family Medicine

## 2020-01-21 ENCOUNTER — Ambulatory Visit (INDEPENDENT_AMBULATORY_CARE_PROVIDER_SITE_OTHER): Payer: 59 | Admitting: Family Medicine

## 2020-01-21 ENCOUNTER — Other Ambulatory Visit: Payer: Self-pay

## 2020-01-21 ENCOUNTER — Ambulatory Visit: Payer: Self-pay

## 2020-01-21 DIAGNOSIS — M25561 Pain in right knee: Secondary | ICD-10-CM

## 2020-01-21 DIAGNOSIS — Q796 Ehlers-Danlos syndrome, unspecified: Secondary | ICD-10-CM

## 2020-01-21 DIAGNOSIS — M545 Low back pain, unspecified: Secondary | ICD-10-CM

## 2020-01-21 DIAGNOSIS — M25562 Pain in left knee: Secondary | ICD-10-CM

## 2020-01-21 DIAGNOSIS — M25551 Pain in right hip: Secondary | ICD-10-CM | POA: Diagnosis not present

## 2020-01-21 DIAGNOSIS — M79641 Pain in right hand: Secondary | ICD-10-CM

## 2020-01-21 DIAGNOSIS — G8929 Other chronic pain: Secondary | ICD-10-CM

## 2020-01-21 DIAGNOSIS — M25552 Pain in left hip: Secondary | ICD-10-CM

## 2020-01-21 DIAGNOSIS — D8940 Mast cell activation, unspecified: Secondary | ICD-10-CM

## 2020-01-21 DIAGNOSIS — M79642 Pain in left hand: Secondary | ICD-10-CM

## 2020-01-21 NOTE — Progress Notes (Signed)
Office Visit Note   Patient: Jean Larson           Date of Birth: 1996/10/30           MRN: [id number] Visit Date: 01/21/2020 Requested by: Maurice Small, MD [address] [place],  [place] [address] PCP: Maurice Small, MD  Subjective: Chief Complaint  Patient presents with  . EDS: pain bil knees, hands, hips/lower back    HPI: She is here to establish orthopedic care.  I have seen her in the past and her mother comes to see me as well.  Patient was diagnosed with Ehlers-Danlos syndrome at around age 88 after having back pain that did not heal for many months.  Her mother has this condition as well.  She has hypermobility of her joints but has not had any cardiac manifestations other than POTS syndrome.  For a while she was treated with nortriptyline for pain but it was causing increased tachycardia so she is not on anything right now.  Her mother is being treated with low-dose naltrexone and patient is scheduled to meet with a provider this afternoon to discuss that possibility.  Today her left shoulder is one of her biggest areas of pain.  She has had recurrent subluxations in the shoulder.  Both of her hands also bother her, with stiffness and occasional swelling in all of her fingers.  Her fingers and her toes occasionally turn red during the day, regardless of the temperature outside.  She has bilateral knee pain and a sensation of instability when walking.  She has occasional pain in her lateral hips when she is walking with a popping sensation.  Denies any groin pain.  She sees Lorenda Peck for physical therapy.  She has mast cell activation syndrome and manages this with a low histamine diet.  Previous labs have been notable for positive thyroid antibodies but otherwise normal thyroid studies.               ROS:   All other systems were reviewed and are negative.  Objective: Vital Signs: There were no vitals taken for this visit.  Physical Exam:    General:  Alert and oriented, in no acute distress. Pulm:  Breathing unlabored. Psy:  Normal mood, congruent affect. Skin: Slight erythema of her fingers in both hands.  No temperature change. Left shoulder: She has guarding with abduction and external rotation.  I do not think she has adhesive capsulitis but her range of motion is limited due to guarding. Hands: She has no synovitis of her joints and hand.  She has full range of motion of her fingers. Knees: Ligaments feel slightly lax but with solid endpoints.  No joint effusion today.   Imaging: X-rays hands: No sign of degenerative change.  Joint spaces are well-preserved no subchondral cystic change.   Assessment & Plan: 1.  Ehlers-Danlos syndrome with hypermobility -Getting ready to meet with a provider this afternoon to discuss low-dose naltrexone therapy.  2.  Chronic left shoulder instability -She does home exercises for maintenance.  3.  Chronic bilateral hand pain - Labs to evaluate. -Compressive brace for comfort.  4.  Chronic bilateral knee pain with instability -Knee braces.  5.  Bilateral hip pain -Tenuous physical therapy.    Procedures: No procedures performed  No notes on file     PMFS History: Patient Active Problem List   Diagnosis Date Noted  . Abnormal weight loss 09/14/2016  . Hypercortisolemia 06/02/2016  . Nausea  05/29/2016  . Abdominal pain of multiple sites 02/11/2016  . Irritable bowel syndrome with constipation 02/11/2016  . Thyroid function test abnormal 07/07/2015  . Lymphadenopathy 07/07/2015  . Fatigue 07/07/2015  . Dysautonomia orthostatic hypotension syndrome ([place]) 11/17/2013  . Dizziness 11/17/2013  . Ehlers-Danlos syndrome 11/17/2013  . Dysautonomia ([place]) 09/23/2013  . Vertigo 09/23/2013  . POTS (postural orthostatic tachycardia syndrome) 04/17/2013  . Functional murmur 09/05/2012  . Chest pain 08/06/2012  . Generalized hypermobility of joints 08/06/2012   Past Medical  History:  Diagnosis Date  . Dysautonomia ([place])   . Ehlers-Danlos disease   . GERD (gastroesophageal reflux disease)   . Hypermobility of joint   . POTS (postural orthostatic tachycardia syndrome)     Family History  Problem Relation Age of Onset  . Asthma Sister   . Heart disease Maternal Grandfather   . Hypertension Maternal Grandfather   . Stroke Maternal Grandfather   . Alzheimer's disease Maternal Grandfather   . Parkinson's disease Maternal Grandfather     History reviewed. No pertinent surgical history. Social History   Occupational History  . Not on file  Tobacco Use  . Smoking status: Never Smoker  . Smokeless tobacco: Never Used  Substance and Sexual Activity  . Alcohol use: No  . Drug use: No  . Sexual activity: Never    Birth control/protection: Abstinence

## 2020-01-23 LAB — ANTI-NUCLEAR AB-TITER (ANA TITER): ANA Titer 1: 1:40 {titer} — ABNORMAL HIGH

## 2020-01-23 LAB — C-REACTIVE PROTEIN: CRP: 1 mg/L (ref <8.0)

## 2020-01-23 LAB — URIC ACID: Uric Acid, Serum: 3.4 mg/dL (ref 2.5–7.0)

## 2020-01-23 LAB — RHEUMATOID FACTOR: Rhuematoid fact SerPl-aCnc: <14 IU/mL (ref <14)

## 2020-01-23 LAB — ANA: Anti Nuclear Antibody (ANA): POSITIVE — AB

## 2020-01-23 LAB — CYCLIC CITRUL PEPTIDE ANTIBODY, IGG: Cyclic Citrullin Peptide Ab: <16 UNITS

## 2020-01-23 LAB — SEDIMENTATION RATE: Sed Rate: 6 mm/h (ref 0–20)

## 2020-01-26 ENCOUNTER — Telehealth: Payer: Self-pay | Admitting: Family Medicine

## 2020-01-26 NOTE — Telephone Encounter (Signed)
ANA is mildly positive.    All others look good.

## 2020-11-09 ENCOUNTER — Other Ambulatory Visit: Payer: 59

## 2020-11-09 DIAGNOSIS — Z20822 Contact with and (suspected) exposure to covid-19: Secondary | ICD-10-CM

## 2020-11-11 LAB — SARS-COV-2, NAA 2 DAY TAT

## 2020-11-11 LAB — NOVEL CORONAVIRUS, NAA: SARS-CoV-2, NAA: NOT DETECTED

## 2020-12-21 ENCOUNTER — Other Ambulatory Visit: Payer: Self-pay

## 2020-12-21 ENCOUNTER — Encounter: Payer: Self-pay | Admitting: Family Medicine

## 2020-12-21 ENCOUNTER — Ambulatory Visit (INDEPENDENT_AMBULATORY_CARE_PROVIDER_SITE_OTHER): Payer: 59 | Admitting: Family Medicine

## 2020-12-21 DIAGNOSIS — M25562 Pain in left knee: Secondary | ICD-10-CM | POA: Diagnosis not present

## 2020-12-21 MED ORDER — DICLOFENAC SODIUM 1 % EX GEL: 4.0000 g | Freq: Four times a day (QID) | CUTANEOUS | 6 refills | Status: AC | PRN

## 2020-12-21 NOTE — Progress Notes (Signed)
Office Visit Note   Patient: Jean Larson           Date of Birth: [birth year]           MRN: [id number] Visit Date: 12/21/2020 Requested by: Maurice Small, MD [address] [place],  [place] [address] PCP: Maurice Small, MD  Subjective: Chief Complaint  Patient presents with  . Left Knee - Pain    Pain in the medial and anterior knee - "sharp" pain that is different from her "baseline pain." Has had this pain with certain movements x 1 week now. NKI. Cannot walk very long without pain. Going up stairs, bending the knee a lot, causes a "pinching" pain in the medial knee.    HPI: She is here with left knee pain.  Symptoms started about a week ago, no definite injury.  She has chronic baseline pain in both knees related to EDS.  About a week ago she started noticing pain mostly on the anterior medial aspect, worse when going up stairs and worse when sitting with her knee bent.  She gets a pinching sensation sometimes, and feeling like her knee is going to give out.  Ice gives her some relief.  She wears a neoprene sleeve which helps.  In early January she had a covid booster.  One week later developed hives.  Now on prednisone per another provider.  Still struggling with hives.               ROS:   All other systems were reviewed and are negative.  Objective: Vital Signs: There were no vitals taken for this visit.  Physical Exam:  General:  Alert and oriented, in no acute distress. Pulm:  Breathing unlabored. Psy:  Normal mood, congruent affect. Skin: No erythema or warmth around the knee. Left knee: No joint effusion today.  Full range of motion with negative patella apprehension test, Lachman is solid, no laxity with varus or valgus stress.  She has quite a bit of tenderness along the medial joint line with pain but no definite click on McMurray's.   Imaging: No results found.  Assessment & Plan: 1.  Left knee pain, etiology uncertain.  Could be inflammatory  reaction related to Covid booster.  Cannot rule out medial meniscus injury although there was no good mechanism to account for that. -She will try Voltaren gel topically.  Continue with ice as needed.  If symptoms persist, consider x-rays and possibly MRI scan versus a trial of physical therapy.     Procedures: No procedures performed        PMFS History: Patient Active Problem List   Diagnosis Date Noted  . Mast cell activation syndrome ([facility]) 01/21/2020  . Abnormal weight loss 09/14/2016  . Hypercortisolemia ([facility]) 06/02/2016  . Nausea 05/29/2016  . Abdominal pain of multiple sites 02/11/2016  . Irritable bowel syndrome with constipation 02/11/2016  . Thyroid function test abnormal 07/07/2015  . Lymphadenopathy 07/07/2015  . Fatigue 07/07/2015  . Dysautonomia orthostatic hypotension syndrome 11/17/2013  . Dizziness 11/17/2013  . Ehlers-Danlos syndrome 11/17/2013  . Dysautonomia ([facility]) 09/23/2013  . Vertigo 09/23/2013  . POTS (postural orthostatic tachycardia syndrome) 04/17/2013  . Functional murmur 09/05/2012  . Chest pain 08/06/2012  . Generalized hypermobility of joints 08/06/2012   Past Medical History:  Diagnosis Date  . Dysautonomia ([facility])   . Ehlers-Danlos disease   . GERD (gastroesophageal reflux disease)   . Hypermobility of joint   . POTS (postural orthostatic tachycardia syndrome)  Family History  Problem Relation Age of Onset  . Asthma Sister   . Heart disease Maternal Grandfather   . Hypertension Maternal Grandfather   . Stroke Maternal Grandfather   . Alzheimer's disease Maternal Grandfather   . Parkinson's disease Maternal Grandfather     History reviewed. No pertinent surgical history. Social History   Occupational History  . Not on file  Tobacco Use  . Smoking status: Never Smoker  . Smokeless tobacco: Never Used  Vaping Use  . Vaping Use: Never used  Substance and Sexual Activity  . Alcohol use: No  . Drug use: No  . Sexual activity:  Never    Birth control/protection: Abstinence

## 2020-12-21 NOTE — Patient Instructions (Signed)
    Possible medial meniscus injury:  - Ice as needed.  - Voltaren gel as needed.   Call or MyChart in a couple weeks if not getting better.

## 2020-12-27 ENCOUNTER — Telehealth: Payer: Self-pay | Admitting: Family Medicine

## 2020-12-27 ENCOUNTER — Ambulatory Visit: Payer: Self-pay

## 2020-12-27 DIAGNOSIS — M25562 Pain in left knee: Secondary | ICD-10-CM

## 2020-12-27 NOTE — Telephone Encounter (Signed)
Please advise 

## 2020-12-27 NOTE — Telephone Encounter (Signed)
I called and advised the patient about the possible insurance prior authorization denial. She will be advised if this is the case. She has already been scheduled for 01/11/2021.

## 2020-12-27 NOTE — Telephone Encounter (Signed)
I will order, but there's a chance insurance will deny since she hasn't tried much conservative treatment yet.

## 2020-12-27 NOTE — Addendum Note (Signed)
Addended by: Lillia Carmel on: 12/27/2020 09:02 AM   Modules accepted: Orders

## 2020-12-27 NOTE — Telephone Encounter (Signed)
Pt mother called and states that pt knee is just getting worse! That it is now affecting the hip because of the way she has been walking! Wanted to know if you could get her scheduled for an MRI

## 2020-12-29 ENCOUNTER — Ambulatory Visit
Admission: RE | Admit: 2020-12-29 | Discharge: 2020-12-29 | Disposition: A | Discharge status: Home / Self Care | Payer: 59 | Source: Ambulatory Visit | Attending: Family Medicine | Admitting: Family Medicine

## 2020-12-29 ENCOUNTER — Other Ambulatory Visit: Payer: Self-pay | Admitting: Family Medicine

## 2020-12-29 ENCOUNTER — Other Ambulatory Visit: Payer: Self-pay

## 2020-12-29 DIAGNOSIS — M25562 Pain in left knee: Secondary | ICD-10-CM

## 2020-12-31 ENCOUNTER — Telehealth: Payer: Self-pay | Admitting: Family Medicine

## 2020-12-31 NOTE — Telephone Encounter (Signed)
Bone structure of the knee looks good by x-ray.

## 2021-01-04 ENCOUNTER — Telehealth: Payer: Self-pay | Admitting: Family Medicine

## 2021-01-04 ENCOUNTER — Other Ambulatory Visit: Payer: Self-pay | Admitting: Family Medicine

## 2021-01-04 DIAGNOSIS — M25562 Pain in left knee: Secondary | ICD-10-CM

## 2021-01-04 NOTE — Telephone Encounter (Signed)
They are not going to approve unless she has tried PT first.  I've placed orders.  If she's in that much pain, could do an injection if she'd like.

## 2021-01-04 NOTE — Telephone Encounter (Signed)
I called pt insurance to see if I can speak with someone to do reconsider and that was not an option, they need a Peer to Peer, which is scheduled for tomorrow 01/05/21 at 11:45am with Dr. Delight Stare, they will contact you by your cell number. Please use Case # [id number].  Sorry, I had no other choice.

## 2021-01-04 NOTE — Telephone Encounter (Signed)
Jean Larson pts mother called stating the pt is in a lot of pain and she looked into getting the MRI sooner and was informed [facility] could get the pt in this afternoon if we could get an approval from the insurance. Jean Larson would like a CB if there's anything you can do to get the MRI approved for this afternoon.   [phone]

## 2021-01-11 ENCOUNTER — Other Ambulatory Visit: Payer: 59

## 2021-09-20 ENCOUNTER — Ambulatory Visit: Payer: Self-pay

## 2021-09-20 ENCOUNTER — Encounter: Payer: Self-pay | Admitting: Physician Assistant

## 2021-09-20 ENCOUNTER — Ambulatory Visit: Payer: Commercial Managed Care - PPO | Admitting: Physician Assistant

## 2021-09-20 DIAGNOSIS — M545 Low back pain, unspecified: Secondary | ICD-10-CM

## 2021-09-20 MED ORDER — PREDNISONE 5 MG (21) PO TBPK: ORAL_TABLET | ORAL | 0 refills | Status: AC

## 2021-09-20 NOTE — Progress Notes (Signed)
Office Visit Note   Patient: Jean Larson           Date of Birth: [birth year]           MRN: [id number] Visit Date: 09/20/2021              Requested by: Maurice Small, MD [address] [place],  [place] [address] PCP: Maurice Small, MD  Chief Complaint  Patient presents with   Lower Back - Follow-up      HPI: Patient is a pleasant 25 year old woman with a chief complaint of right lower SI pain with radiation when ambulation down into the back of her leg.  She says this is been going on since the end of August.  She does not have any history of injury but does have a history of Ehlers-Danlos syndrome which was diagnosed when she was 36.  Her mother also has this.  She has had back problems in the past and associated SI symptoms.  She has not had any recent MRIs of her back.  She denies any loss of bowel or bladder control.  She denies any specific weakness.  Pain also goes down into her tailbone.  She has had similar problems in the past she has tried her physical therapy exercises.  She has had luck in the past when this is occurred and oral steroids.  She is wary of injected steroids because her mother had a reaction with them.  Assessment & Plan: Visit Diagnoses:  1. Low back pain, unspecified back pain laterality, unspecified chronicity, unspecified whether sciatica present     Plan: We will go forward with a Depo Medrol Dosepak.  If this calms down her inflammation she would like to return to physical therapy and this would be fine.  If she has no improvement I would recommend reimaging with an MRI with follow-up with Dr. Alvester Morin however right now she does not want to have any injections  Follow-Up Instructions: No follow-ups on file.   Ortho Exam  Patient is alert, oriented, no adenopathy, well-dressed, normal affect, normal respiratory effort. Examination she is focally tender over the SI joint.  She has some increased pain with coming up from flexing her  back.  This produces the pain down her leg.  No paresthesias.  Strength in bilateral lower extremities is 4 out of 5.  She has pain that is reproduced in her SI joint with straight leg raising.  Deep tendon reflexes are symmetric today.  Imaging: No results found. No images are attached to the encounter.  Labs: Lab Results  Component Value Date   ESRSEDRATE 6 01/21/2020   CRP 1.0 01/21/2020   LABURIC 3.4 01/21/2020     Lab Results  Component Value Date   ALBUMIN 4.5 09/17/2014    No results found for: MG No results found for: VD25OH  No results found for: PREALBUMIN CBC EXTENDED Latest Ref Rng & Units 04/11/2017 06/16/2016 11/05/2015  WBC 3.4 - 10.8 x10E3/uL 4.9 5.9 5.7  RBC 3.77 - 5.28 x10E6/uL 4.64 4.18 4.06  HGB 11.1 - 15.9 g/dL 19.6 22.2 97.9  HCT 89.2 - 46.6 % 41.7 36.9 34.6(L)  PLT 150 - 379 x10E3/uL 212 179 173  NEUTROABS 1.4 - 7.0 x10E3/uL 2.2 - -  LYMPHSABS 0.7 - 3.1 x10E3/uL 2.3 - -     There is no height or weight on file to calculate BMI.  Orders:  Orders Placed This Encounter  Procedures   XR Lumbar Spine 2-3 Views  Meds ordered this encounter  Medications   predniSONE (STERAPRED UNI-PAK 21 TAB) 5 MG (21) TBPK tablet    Sig: Take as directed    Dispense:  21 tablet    Refill:  0     Procedures: No procedures performed  Clinical Data: No additional findings.  ROS:  All other systems negative, except as noted in the HPI. Review of Systems  Objective: Vital Signs: There were no vitals taken for this visit.  Specialty Comments:  No specialty comments available.  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   Dizziness 11/17/2013   Ehlers-Danlos syndrome 11/17/2013   Dysautonomia (HCC) 09/23/2013   Vertigo 09/23/2013   POTS (postural orthostatic tachycardia syndrome) 04/17/2013   Functional murmur 09/05/2012   Chest pain 08/06/2012   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 No pertinent surgical history. Social History   Occupational History   Not on file  Tobacco Use   Smoking status: Never   Smokeless tobacco: Never  Vaping Use   Vaping Use: Never used  Substance and Sexual Activity   Alcohol use: No   Drug use: No   Sexual activity: Never    Birth control/protection: Abstinence

## 2021-09-26 ENCOUNTER — Telehealth: Payer: Self-pay | Admitting: Physician Assistant

## 2021-09-26 NOTE — Telephone Encounter (Signed)
Patient called asked if she could pick up her X-rays from her visit last week. Patient has an appointment tomorrow. The number to contact patient is [phone]

## 2021-09-28 ENCOUNTER — Other Ambulatory Visit: Payer: Self-pay | Admitting: Rehabilitation

## 2021-09-28 DIAGNOSIS — M5451 Vertebrogenic low back pain: Secondary | ICD-10-CM

## 2021-10-11 ENCOUNTER — Ambulatory Visit: Payer: Commercial Managed Care - PPO | Admitting: Physician Assistant

## 2021-10-22 ENCOUNTER — Ambulatory Visit
Admission: RE | Admit: 2021-10-22 | Discharge: 2021-10-22 | Disposition: A | Discharge status: Home / Self Care | Payer: Commercial Managed Care - PPO | Source: Ambulatory Visit | Attending: Rehabilitation | Admitting: Rehabilitation

## 2021-10-22 ENCOUNTER — Other Ambulatory Visit: Payer: Self-pay

## 2021-10-22 DIAGNOSIS — M5451 Vertebrogenic low back pain: Secondary | ICD-10-CM

## 2022-03-23 ENCOUNTER — Other Ambulatory Visit: Payer: Self-pay | Admitting: Obstetrics and Gynecology

## 2022-03-23 DIAGNOSIS — R2231 Localized swelling, mass and lump, right upper limb: Secondary | ICD-10-CM

## 2022-04-10 ENCOUNTER — Ambulatory Visit
Admission: RE | Admit: 2022-04-10 | Discharge: 2022-04-10 | Disposition: A | Discharge status: Home / Self Care | Payer: Commercial Managed Care - PPO | Source: Ambulatory Visit | Attending: Obstetrics and Gynecology | Admitting: Obstetrics and Gynecology

## 2022-04-10 DIAGNOSIS — R2231 Localized swelling, mass and lump, right upper limb: Secondary | ICD-10-CM

## 2022-04-18 ENCOUNTER — Other Ambulatory Visit: Payer: Commercial Managed Care - PPO

## 2022-04-21 ENCOUNTER — Encounter (HOSPITAL_BASED_OUTPATIENT_CLINIC_OR_DEPARTMENT_OTHER): Payer: Self-pay | Admitting: Emergency Medicine

## 2022-04-21 ENCOUNTER — Emergency Department (HOSPITAL_BASED_OUTPATIENT_CLINIC_OR_DEPARTMENT_OTHER)
Admission: EM | Admit: 2022-04-21 | Discharge: 2022-04-21 | Disposition: A | Discharge status: Home / Self Care | Payer: Commercial Managed Care - PPO | Attending: Emergency Medicine | Admitting: Emergency Medicine

## 2022-04-21 ENCOUNTER — Other Ambulatory Visit: Payer: Self-pay

## 2022-04-21 DIAGNOSIS — D8940 Mast cell activation, unspecified: Secondary | ICD-10-CM | POA: Diagnosis not present

## 2022-04-21 DIAGNOSIS — G90A Postural orthostatic tachycardia syndrome (POTS): Secondary | ICD-10-CM | POA: Diagnosis not present

## 2022-04-21 DIAGNOSIS — R102 Pelvic and perineal pain: Secondary | ICD-10-CM | POA: Insufficient documentation

## 2022-04-21 DIAGNOSIS — N939 Abnormal uterine and vaginal bleeding, unspecified: Secondary | ICD-10-CM

## 2022-04-21 LAB — URINALYSIS, ROUTINE W REFLEX MICROSCOPIC

## 2022-04-21 LAB — CBC WITH DIFFERENTIAL/PLATELET
Abs Immature Granulocytes: 0.02 10*3/uL (ref 0.00–0.07)
Basophils Absolute: 0 10*3/uL (ref 0.0–0.1)
Basophils Relative: 1 %
Eosinophils Absolute: 0.1 10*3/uL (ref 0.0–0.5)
Eosinophils Relative: 1 %
HCT: 37.8 % (ref 36.0–46.0)
Hemoglobin: 13 g/dL (ref 12.0–15.0)
Immature Granulocytes: 0 %
Lymphocytes Relative: 43 %
Lymphs Abs: 2.9 10*3/uL (ref 0.7–4.0)
MCH: 29.8 pg (ref 26.0–34.0)
MCHC: 34.4 g/dL (ref 30.0–36.0)
MCV: 86.7 fL (ref 80.0–100.0)
Monocytes Absolute: 0.4 10*3/uL (ref 0.1–1.0)
Monocytes Relative: 6 %
Neutro Abs: 3.3 10*3/uL (ref 1.7–7.7)
Neutrophils Relative %: 49 %
Platelets: 200 10*3/uL (ref 150–400)
RBC: 4.36 MIL/uL (ref 3.87–5.11)
RDW: 11.5 % (ref 11.5–15.5)
WBC: 6.7 10*3/uL (ref 4.0–10.5)
nRBC: 0 % (ref 0.0–0.2)

## 2022-04-21 LAB — BASIC METABOLIC PANEL
Anion gap: 8 (ref 5–15)
BUN: 9 mg/dL (ref 6–20)
CO2: 25 mmol/L (ref 22–32)
Calcium: 9.9 mg/dL (ref 8.9–10.3)
Chloride: 103 mmol/L (ref 98–111)
Creatinine, Ser: 0.72 mg/dL (ref 0.44–1.00)
GFR, Estimated: >60 mL/min (ref >60)
Glucose, Bld: 86 mg/dL (ref 70–99)
Potassium: 4.2 mmol/L (ref 3.5–5.1)
Sodium: 136 mmol/L (ref 135–145)

## 2022-04-21 LAB — PREGNANCY, URINE: Preg Test, Ur: NEGATIVE

## 2022-04-21 LAB — URINALYSIS, MICROSCOPIC (REFLEX): RBC / HPF: >50 RBC/hpf (ref 0–5)

## 2022-04-21 MED ORDER — SODIUM CHLORIDE 0.9 % IV BOLUS
500.0000 mL | Freq: Once | INTRAVENOUS | Status: AC
  Administered 2022-04-21: 500 mL via INTRAVENOUS

## 2022-04-21 MED ORDER — PROMETHAZINE HCL 25 MG PO TABS: 25.0000 mg | ORAL_TABLET | Freq: Four times a day (QID) | ORAL | 0 refills | Status: AC | PRN

## 2022-04-21 NOTE — ED Notes (Signed)
Pelvic Cart placed outside room.

## 2022-04-21 NOTE — Discharge Instructions (Addendum)
Please follow-up with your OB/GYN first thing on Monday as discussed for reevaluation and continued medical management.  If you are unable to follow-up with your OB/GYN promptly, you may follow-up with your PCP instead.  Continue utilizing your Zofran or the Phenergan as needed.  Please focus on resting over the next few days, with emphasis on fluid rehydration and nutritional intake.  Avoid driving or using heavy machinery for the next few days until you are able to follow-up with OB/GYN as discussed.  You may also utilize ibuprofen and Tylenol simultaneously to continue to manage pain.  May take 800 mg ibuprofen every 6 hours as needed.  You may take 1000 mg Tylenol every 6 hours as needed as well.  Always take these with plenty of food and water first, then take the medications.  Return to the ED for new or worsening symptoms as discussed.

## 2022-04-21 NOTE — ED Triage Notes (Signed)
Vaginal bleeding with large blood clots, larger then a quarter.pelvic pain.(Radiates into back and hips) Has change 3 pads since 10AM. Started this morning. Has history of similar was started on new birth control pill on Tuesday by obgyn. Had vaginal US done Monday. Took ibuprofen at home around 1pm for pain with some relief.

## 2022-04-21 NOTE — ED Provider Notes (Signed)
[facility] EMERGENCY DEPT Provider Note   CSN: [id number] Arrival date & time: 04/21/22  1356     History  Chief Complaint  Patient presents with   Vaginal Bleeding    Jean Larson is a 26 y.o. female with chief complaint of vaginal bleeding.  Has been bleeding for the last 3 days, recently passed some "large clots" and wanted to come to the ED to make sure she was okay.  Recently switched from combo birth control pill to progesterone only this past week.  Well-established Hx of menorrhagia and metrorrhagia.  Also Hx of POTS and Mast Cell Activation Syndrome.  Had a transabdominal and transvaginal ultrasound on Monday per pt, which showed no evidence of cysts, fibroids, pregnancy, or other abnormalities.  Denies possibility of pregnancy.  Intermittent pelvic pain and dyspareunia.  Denies urinary symptoms, fever, vomiting, diarrhea, neck stiffness, vision changes.  The history is provided by the patient, medical records and a parent.  Vaginal Bleeding Associated symptoms: dyspareunia        Home Medications Prior to Admission medications   Medication Sig Start Date End Date Taking? Authorizing Provider  promethazine (PHENERGAN) 25 MG tablet Take 1 tablet (25 mg total) by mouth every 6 (six) hours as needed for nausea or vomiting. 04/21/22  Yes Cecil Cobbs, PA-C  CORLANOR 5 MG TABS tablet Take 2.5 mg by mouth 2 (two) times daily. 01/06/20   [provider]  cromolyn (GASTROCROM) 100 MG/5ML solution cromolyn 100 mg/5 mL oral concentrate  INHALE 1 VIAL BEFORE EACH MEAL QID    [provider]  diclofenac Sodium (VOLTAREN) 1 % GEL Apply 4 g topically 4 (four) times daily as needed. 12/21/20   Hilts, Casimiro Needle, MD  EPINEPHrine 0.3 mg/0.3 mL IJ SOAJ injection INJECT INTRAMUSCULARLY AS DIRECTED 07/12/16   [provider]  famotidine (PEPCID) 20 MG tablet 1 tablet at bedtime as needed 11/25/20   [provider]  hydrocortisone 2.5 % cream  APPLY TO AFFECTED AREA TWICE A DAY 1 WEEK ON 1 WEEK OFF AND THEN AS NEEDED 10/21/19   [provider]  levocetirizine (XYZAL) 5 MG tablet SMARTSIG:1 Tablet(s) By Mouth Every Evening 12/15/20   [provider]  loratadine (CLARITIN) 10 MG tablet Take 10 mg by mouth 2 (two) times daily.    [provider]  LORAZEPAM PO Take by mouth 2 (two) times daily.    [provider]  methylphenidate (RITALIN) 10 MG tablet Take 10 mg by mouth daily.     [provider]  MICROGESTIN 1-20 MG-MCG tablet Take 1 tablet by mouth daily. 02/15/15   [provider]  Olopatadine HCl 0.6 % SOLN Place 2 sprays into both nostrils 2 (two) times daily. 10/27/20   [provider]  ondansetron (ZOFRAN) 4 MG tablet 1 tablet 01/28/20   [provider]  ondansetron (ZOFRAN-ODT) 4 MG disintegrating tablet Take 4 mg by mouth daily as needed. 10/17/19   [provider]  predniSONE (STERAPRED UNI-PAK 21 TAB) 5 MG (21) TBPK tablet Take as directed 09/20/21   Persons, [place], PA  triamcinolone ointment (KENALOG) 0.1 % SMARTSIG:Sparingly Topical Twice Daily PRN 10/27/20   [provider]      Allergies    Cherry, Contrast media [iodinated contrast media], Plum pulp, and Tramadol    Review of Systems   Review of Systems  Genitourinary:  Positive for dyspareunia, menstrual problem and vaginal bleeding.    Physical Exam Updated Vital Signs BP 110/85 (BP Location:  Right Arm)   Pulse 95   Temp 98.3 F (36.8 C) (Temporal)   Resp 16   Ht 5\' 3"  (1.6 m)   Wt 46.3 kg   SpO2 100%   BMI 18.07 kg/m  Physical Exam Vitals and nursing note reviewed. Exam conducted with a chaperone present.  Constitutional:      General: She is not in acute distress.    Appearance: Normal appearance. She is well-developed and underweight. She is not ill-appearing, toxic-appearing or diaphoretic.  HENT:     Head: Normocephalic and atraumatic.  Eyes:      Conjunctiva/sclera: Conjunctivae normal.  Cardiovascular:     Rate and Rhythm: Normal rate and regular rhythm.     Heart sounds: No murmur heard. Pulmonary:     Effort: Pulmonary effort is normal. No respiratory distress.     Breath sounds: Normal breath sounds.  Abdominal:     Palpations: Abdomen is soft.     Tenderness: There is no abdominal tenderness.  Genitourinary:    Exam position: Lithotomy position.     Pubic Area: No rash.      Labia:        Right: No rash, tenderness, lesion or injury.        Left: No rash, tenderness, lesion or injury.      Cervix: Cervical bleeding present.     Uterus: Tender.      Adnexa:        Right: Tenderness present.        Left: Tenderness present.      Comments: Blood visualized in the vaginal canal.  Blood appears clotted.  No evidence of discharge, lesions, prolapse, erythema, foreign body, or injury of the vagina.  Cervix without CMT, discharge, lesions, or friability. On bimanual exam, mild uterine tenderness and mild bilateral adnexal tenderness, right slightly worse than left per patient.   Musculoskeletal:        General: No swelling.     Cervical back: Neck supple.  Skin:    General: Skin is warm and dry.     Capillary Refill: Capillary refill takes less than 2 seconds.  Neurological:     Mental Status: She is alert.  Psychiatric:        Mood and Affect: Mood normal.        Behavior: Behavior is cooperative.     ED Results / Procedures / Treatments   Labs (all labs ordered are listed, but only abnormal results are displayed) Labs Reviewed  URINALYSIS, ROUTINE W REFLEX MICROSCOPIC - Abnormal; Notable for the following components:      Result Value   Color, Urine RED (*)    APPearance TURBID (*)    Glucose, UA   (*)    Value: TEST NOT REPORTED DUE TO COLOR INTERFERENCE OF URINE PIGMENT   Hgb urine dipstick   (*)    Value: TEST NOT REPORTED DUE TO COLOR INTERFERENCE OF URINE PIGMENT   Bilirubin Urine   (*)    Value: TEST NOT  REPORTED DUE TO COLOR INTERFERENCE OF URINE PIGMENT   Ketones, ur   (*)    Value: TEST NOT REPORTED DUE TO COLOR INTERFERENCE OF URINE PIGMENT   Protein, ur   (*)    Value: TEST NOT REPORTED DUE TO COLOR INTERFERENCE OF URINE PIGMENT   Nitrite   (*)    Value: TEST NOT REPORTED DUE TO COLOR INTERFERENCE OF URINE PIGMENT   Leukocytes,Ua   (*)    Value: TEST NOT REPORTED DUE  TO COLOR INTERFERENCE OF URINE PIGMENT   All other components within normal limits  URINALYSIS, MICROSCOPIC (REFLEX) - Abnormal; Notable for the following components:   Bacteria, UA RARE (*)    Non Squamous Epithelial PRESENT (*)    All other components within normal limits  PREGNANCY, URINE  CBC WITH DIFFERENTIAL/PLATELET  BASIC METABOLIC PANEL    EKG None  Radiology No results found.  Procedures Pelvic exam  Date/Time: 04/21/2022 11:35 PM  Performed by: Cecil Cobbs, PA-C Authorized by: Cecil Cobbs, PA-C  Consent: Verbal consent obtained. Risks and benefits: risks, benefits and alternatives were discussed Consent given by: patient Patient understanding: patient states understanding of the procedure being performed Patient consent: the patient's understanding of the procedure matches consent given Patient identity confirmed: verbally with patient and arm band Preparation: Patient was prepped and draped in the usual sterile fashion. Patient tolerance: patient tolerated the procedure well with no immediate complications       Medications Ordered in ED Medications  sodium chloride 0.9 % bolus 500 mL (0 mLs Intravenous Stopped 04/21/22 1921)    ED Course/ Medical Decision Making/ A&P                           Medical Decision Making Amount and/or Complexity of Data Reviewed External Data Reviewed: notes. Labs: ordered. Decision-making details documented in ED Course. Radiology: ordered and independent interpretation performed. Decision-making details documented in ED  Course. ECG/medicine tests: ordered and independent interpretation performed. Decision-making details documented in ED Course.  Risk OTC drugs. Prescription drug management.   26 y.o. female presents to the ED for concern of Vaginal Bleeding   This involves an extensive number of treatment options, and is a complaint that carries with it a high risk of complications and morbidity.  The emergent differential diagnosis prior to evaluation includes, but is not limited to: Hemorrhagic ovarian cyst rupture, irregular menses, miscarriage or abortion, PID, leiomyoma, ectopic pregnancy  This is not an exhaustive differential.   Past Medical History / Co-morbidities / Social History: POTS, mast cell activation syndrome, Ehlers-Danlos syndrome, dysautonomia, vertigo, IBS, GERD, menorrhagia, dysmenorrhea  Additional History:  Internal and external records from outside source obtained and reviewed including allergy, family medicine, cardiology, internal medicine  Physical Exam: Physical exam performed. The pertinent findings include: Blood in the vaginal vault, mild uterine tenderness.  Lab Tests: I ordered, and personally interpreted labs.  The pertinent results include:   CBC: Unremarkable, no evidence of anemia CMP/BMP: Unremarkable Urine pregnancy: Negative Urinalysis: Significant quantities of blood, likely from vaginal bleeding  Imaging Studies: None  Medications: I have reviewed the patients home medicines and have made adjustments as needed  Procedures: Patient underwent pelvic exam, described above in further detail.  Reevaluation of patient after this intervention showed that the patient tolerated this well without complications.  ED Course: Pt well-appearing on exam.  Presenting with prolonged vaginal bleeding.  Well-established Hx of menorrhagia, dysmenorrhea, and chronic suprapubic pain with menstruation.  Pt states suprapubic tenderness feels close to her baseline.   Currently being evaluated by OB/GYN.  Recently had an transabdominal and transvaginal ultrasound, which was negative on all accounts per pt.  No new pain on exam today.  Within the last few days was switched from combination OCP to progesterone only OCP.  3 days later (also 3 days ago) began having moderate bleeding.  Hx of POTS and autosynnoia.  Pregnancy test negative, no suspicious of pregnancy, miscarriage, or  spontaneous abortion.  New leiomyoma or hemorrhagic cyst are clinically unlikely to have developed within the last 5 days to be significant enough to cause patient's presentation today.  Without urinary symptoms, not suspicious of UTI.  No recent sexual activity and without abnormal vaginal discharge, low suspicion for STD or PID.  Presentation, history, and exam does not correlate clinically for ovarian torsion.    Mild to moderate amount of stagnant blood in the vaginal canal appreciated on pelvic exam.  No active bleeding visualized.  Otherwise unremarkable.  After pelvic exam, pt felt mildly fatigued/dizzy.  Hx of chronic dizziness and POTS may be contributory to this.  Without evidence of anemia and not concerned for distributive shock.  Blood loss volume minimal.  Plan to provide IVF and reassess.  Upon re-evaluation pt reports significant improvement.  Overall, I am uncertain the exact etiology of the patient's symptoms.  However, I do not believe she is currently experiencing a medical, surgical, or psychiatric emergency.  I believe the excessive bleeding is likely due to the recent change in OCP regimen.  Recommended close follow up with OB/GYN for re-evaluation and continued medical management.  Also recommended symptomatic management and rest.  Pt satisfied with today's visit.  In NAD and good condition at time of discharge.  Disposition:  After consideration of the diagnostic results and the patient's encounter today, I feel that the emergency department workup does not suggest an  emergent condition requiring admission or immediate intervention beyond what has been performed at this time.  The patient is safe for discharge and has been instructed to return immediately for worsening symptoms, change in symptoms or any other concerns.  Discussed course of treatment thoroughly with the patient, whom demonstrated understanding.  Patient in agreement and has no further questions.  I discussed this case with my attending physician Dr. Stevie Kern, who agreed with the proposed treatment course and cosigned this note including patient's presenting symptoms, physical exam, and planned diagnostics and interventions.  Attending physician stated agreement with plan or made changes to plan which were implemented.     This chart was dictated using voice recognition software.  Despite best efforts to proofread, errors can occur which can change the documentation meaning.         Final Clinical Impression(s) / ED Diagnoses Final diagnoses:  Abnormal vaginal bleeding  Pelvic pain  POTS (postural orthostatic tachycardia syndrome)  Mast cell activation syndrome (HCC)    Rx / DC Orders ED Discharge Orders          Ordered    promethazine (PHENERGAN) 25 MG tablet  Every 6 hours PRN        04/21/22 1907              Cecil Cobbs, PA-C 04/22/22 0000    Milagros Loll, MD 04/24/22 0730

## 2022-04-22 ENCOUNTER — Encounter (HOSPITAL_BASED_OUTPATIENT_CLINIC_OR_DEPARTMENT_OTHER): Payer: Self-pay | Admitting: Emergency Medicine
